# Patient Record
Sex: Male | Born: 1955 | Race: Black or African American | Hispanic: No | Marital: Married | Smoking: Never smoker
Health system: Southern US, Community
[De-identification: ages and names within clinical notes are randomized; demographics above are authoritative.]

## PROBLEM LIST (undated history)

## (undated) DIAGNOSIS — E7800 Pure hypercholesterolemia, unspecified: Secondary | ICD-10-CM

## (undated) DIAGNOSIS — I10 Essential (primary) hypertension: Secondary | ICD-10-CM

---

## 1997-12-06 ENCOUNTER — Ambulatory Visit (HOSPITAL_COMMUNITY): Admission: RE | Admit: 1997-12-06 | Discharge: 1997-12-06 | Payer: Self-pay | Admitting: *Deleted

## 1998-09-17 ENCOUNTER — Emergency Department (HOSPITAL_COMMUNITY): Admission: EM | Admit: 1998-09-17 | Discharge: 1998-09-17 | Payer: Self-pay | Admitting: Emergency Medicine

## 1998-12-07 ENCOUNTER — Emergency Department (HOSPITAL_COMMUNITY): Admission: EM | Admit: 1998-12-07 | Discharge: 1998-12-08 | Payer: Self-pay | Admitting: Emergency Medicine

## 1999-01-19 ENCOUNTER — Emergency Department (HOSPITAL_COMMUNITY): Admission: EM | Admit: 1999-01-19 | Discharge: 1999-01-19 | Payer: Self-pay | Admitting: Emergency Medicine

## 1999-03-08 ENCOUNTER — Emergency Department (HOSPITAL_COMMUNITY): Admission: EM | Admit: 1999-03-08 | Discharge: 1999-03-08 | Payer: Self-pay | Admitting: Emergency Medicine

## 1999-03-08 ENCOUNTER — Encounter: Payer: Self-pay | Admitting: Emergency Medicine

## 1999-03-11 ENCOUNTER — Encounter: Payer: Self-pay | Admitting: Emergency Medicine

## 1999-03-11 ENCOUNTER — Emergency Department (HOSPITAL_COMMUNITY): Admission: EM | Admit: 1999-03-11 | Discharge: 1999-03-11 | Payer: Self-pay | Admitting: Emergency Medicine

## 1999-05-12 ENCOUNTER — Ambulatory Visit (HOSPITAL_COMMUNITY): Admission: RE | Admit: 1999-05-12 | Discharge: 1999-05-12 | Payer: Self-pay | Admitting: Gastroenterology

## 1999-10-15 ENCOUNTER — Emergency Department (HOSPITAL_COMMUNITY): Admission: EM | Admit: 1999-10-15 | Discharge: 1999-10-15 | Payer: Self-pay | Admitting: Emergency Medicine

## 2002-08-19 ENCOUNTER — Encounter: Payer: Self-pay | Admitting: Emergency Medicine

## 2002-08-19 ENCOUNTER — Emergency Department (HOSPITAL_COMMUNITY): Admission: EM | Admit: 2002-08-19 | Discharge: 2002-08-19 | Payer: Self-pay | Admitting: Emergency Medicine

## 2002-10-01 ENCOUNTER — Ambulatory Visit (HOSPITAL_BASED_OUTPATIENT_CLINIC_OR_DEPARTMENT_OTHER): Admission: RE | Admit: 2002-10-01 | Discharge: 2002-10-01 | Payer: Self-pay | Admitting: Pulmonary Disease

## 2002-12-18 ENCOUNTER — Ambulatory Visit (HOSPITAL_BASED_OUTPATIENT_CLINIC_OR_DEPARTMENT_OTHER): Admission: RE | Admit: 2002-12-18 | Discharge: 2002-12-18 | Payer: Self-pay | Admitting: Pulmonary Disease

## 2002-12-29 ENCOUNTER — Emergency Department (HOSPITAL_COMMUNITY): Admission: EM | Admit: 2002-12-29 | Discharge: 2002-12-29 | Payer: Self-pay | Admitting: Emergency Medicine

## 2005-02-22 ENCOUNTER — Emergency Department (HOSPITAL_COMMUNITY): Admission: EM | Admit: 2005-02-22 | Discharge: 2005-02-22 | Payer: Self-pay | Admitting: Emergency Medicine

## 2007-04-23 ENCOUNTER — Emergency Department (HOSPITAL_COMMUNITY): Admission: EM | Admit: 2007-04-23 | Discharge: 2007-04-23 | Payer: Self-pay | Admitting: Emergency Medicine

## 2009-11-15 ENCOUNTER — Encounter: Admission: RE | Admit: 2009-11-15 | Discharge: 2009-11-15 | Payer: Self-pay | Admitting: Internal Medicine

## 2010-11-16 ENCOUNTER — Emergency Department (HOSPITAL_COMMUNITY)
Admission: EM | Admit: 2010-11-16 | Discharge: 2010-11-17 | Disposition: A | Discharge status: Home / Self Care | Payer: Managed Care, Other (non HMO) | Attending: Emergency Medicine | Admitting: Emergency Medicine

## 2010-11-16 DIAGNOSIS — T783XXA Angioneurotic edema, initial encounter: Secondary | ICD-10-CM | POA: Insufficient documentation

## 2010-11-16 DIAGNOSIS — E785 Hyperlipidemia, unspecified: Secondary | ICD-10-CM | POA: Insufficient documentation

## 2010-11-16 DIAGNOSIS — X58XXXA Exposure to other specified factors, initial encounter: Secondary | ICD-10-CM | POA: Insufficient documentation

## 2010-11-16 DIAGNOSIS — I10 Essential (primary) hypertension: Secondary | ICD-10-CM | POA: Insufficient documentation

## 2018-01-01 ENCOUNTER — Emergency Department (HOSPITAL_COMMUNITY): Payer: Managed Care, Other (non HMO)

## 2018-01-01 ENCOUNTER — Emergency Department (HOSPITAL_COMMUNITY)
Admission: EM | Admit: 2018-01-01 | Discharge: 2018-01-01 | Disposition: A | Discharge status: Home / Self Care | Payer: Managed Care, Other (non HMO) | Attending: Emergency Medicine | Admitting: Emergency Medicine

## 2018-01-01 ENCOUNTER — Encounter (HOSPITAL_COMMUNITY): Payer: Self-pay

## 2018-01-01 ENCOUNTER — Other Ambulatory Visit: Payer: Self-pay

## 2018-01-01 DIAGNOSIS — R0789 Other chest pain: Secondary | ICD-10-CM | POA: Insufficient documentation

## 2018-01-01 DIAGNOSIS — Z79899 Other long term (current) drug therapy: Secondary | ICD-10-CM | POA: Diagnosis not present

## 2018-01-01 DIAGNOSIS — I10 Essential (primary) hypertension: Secondary | ICD-10-CM | POA: Insufficient documentation

## 2018-01-01 HISTORY — DX: Essential (primary) hypertension: I10

## 2018-01-01 MED ORDER — CYCLOBENZAPRINE HCL 10 MG PO TABS
10.0000 mg | ORAL_TABLET | Freq: Once | ORAL | Status: AC
  Administered 2018-01-01: 10 mg via ORAL
  Filled 2018-01-01: qty 1

## 2018-01-01 MED ORDER — CYCLOBENZAPRINE HCL 10 MG PO TABS: 10.0000 mg | ORAL_TABLET | Freq: Two times a day (BID) | ORAL | 0 refills | Status: AC | PRN

## 2018-01-01 MED ORDER — IBUPROFEN 800 MG PO TABS
800.0000 mg | ORAL_TABLET | Freq: Once | ORAL | Status: AC
  Administered 2018-01-01: 800 mg via ORAL
  Filled 2018-01-01: qty 1

## 2018-01-01 MED ORDER — IBUPROFEN 800 MG PO TABS: 800.0000 mg | ORAL_TABLET | Freq: Three times a day (TID) | ORAL | 0 refills | Status: AC | PRN

## 2018-01-01 NOTE — ED Provider Notes (Signed)
[facility]-EMERGENCY DEPT Provider Note   CSN: [id number] Arrival date & time: 01/01/18  1009     History   Chief Complaint Chief Complaint  Patient presents with  . Muscle Pain    HPI Ryan Heath is a 62 y.o. male.  The history is provided by the patient. No language interpreter was used.  Muscle Pain    Ryan Heath is a 62 y.o. male who presents to the Emergency Department complaining of muscle spasm. Patient presents to the emergency department for evaluation of right upper chest wall pain that he describes as a muscle spasm. The pain began when he was shaving his head. He states that the spasm sends a pain that radiates down his right arm to the elbow. Pain is worse with ranging the shoulder as well as turning his head from side to side. Overall the pain is improving since it began. He denies any headache, shortness of breath, cough, diaphoresis, nausea, vomiting, abdominal pain, leg swelling or pain. He has a history of hypertension and hyperlipidemia. He does not smoke. 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 No history of cardiac disease or blood clots. No family history of cardiac disease or blood clots. Past Medical History:  Diagnosis Date  . Hypertension     There are no active problems to display for this patient.   History reviewed. No pertinent surgical history.      Home Medications    Prior to Admission medications   Medication Sig Start Date End Date Taking? Authorizing Provider  amLODipine (NORVASC) 10 MG tablet Take 10 mg by mouth daily. 12/26/17  Yes [provider]  gabapentin (NEURONTIN) 100 MG capsule Take 100-400 mg by mouth at bedtime as needed (pain).  12/12/17  Yes [provider]  lisinopril (PRINIVIL,ZESTRIL) 20 MG tablet Take 20 mg by mouth daily. 12/26/17  Yes [provider]  pravastatin (PRAVACHOL) 20 MG tablet Take 20 mg by mouth daily. 12/26/17  Yes [provider]  cyclobenzaprine (FLEXERIL) 10 MG tablet Take 1 tablet (10 mg total) by mouth 2 (two) times daily as needed for muscle spasms. 01/01/18   Tilden Fossa, MD  ibuprofen (ADVIL,MOTRIN) 800 MG tablet Take 1 tablet (800 mg total) by mouth every 8 (eight) hours as needed. 01/01/18   Tilden Fossa, MD    Family History History reviewed. No pertinent family history.  Social History Social History   Tobacco Use  . Smoking status: Never Smoker  . Smokeless tobacco: Never Used  Substance Use Topics  . Alcohol use: Yes    Comment: OCC  . Drug use: Never     Allergies   Patient has no known allergies.   Review of Systems Review of Systems  All other systems reviewed and are negative.    Physical Exam Updated Vital Signs BP (!) 161/83 (BP Location: Left Arm)   Pulse 79   Temp 97.8 F (36.6 C) (Oral)   Resp 18   Ht 6\' 3"  (1.905 m)   Wt 120.2 kg   SpO2 98%   BMI 33.12 kg/m   Physical Exam  Constitutional: He is oriented to person, place, and time. He appears well-developed and well-nourished.  HENT:  Head: Normocephalic and atraumatic.  Cardiovascular: Normal rate and regular rhythm.  No murmur heard. Pulmonary/Chest: Effort normal and breath sounds normal. No respiratory distress.  Mild tenderness to palpation over the right upper chest wall.  Abdominal: Soft. There is no tenderness. There  is no rebound and no guarding.  Musculoskeletal: He exhibits no edema or tenderness.  2+ radial pulses bilaterally. Full range of motion intact throughout the right upper extremity  Neurological: He is alert and oriented to person, place, and time.  Five out of five strength in all four extremities with sensation to light touch intact in all four extremities  Skin: Skin is warm and dry.  Psychiatric: He has a normal mood and affect. His behavior is normal.  Nursing note and vitals reviewed.    ED Treatments / Results  Labs (all labs ordered are listed, but only  abnormal results are displayed) Labs Reviewed - No data to display  EKG EKG Interpretation  Date/Time:  Sunday January 01 2018 10:34:55 EDT Ventricular Rate:  74 PR Interval:    QRS Duration: 87 QT Interval:  354 QTC Calculation: 393 R Axis:   29 Text Interpretation:  Sinus rhythm Minimal ST elevation, anterior leads c/w early repolarization. Confirmed by Lucille Crichlow ([id number]) on 01/01/2018 11:09:28 AM   Radiology Dg Chest 2 View  Result Date: 01/01/2018 CLINICAL DATA:  Right chest pain EXAM: CHEST - 2 VIEW COMPARISON:  04/23/2007 FINDINGS: Lungs are clear.  No pleural effusion or pneumothorax. The heart is normal in size. Visualized osseous structures are within normal limits. IMPRESSION: Normal chest radiographs. Electronically Signed   By: Sriyesh  Krishnan M.D.   On: 01/01/2018 11:57    Procedures Procedures (including critical care time)  Medications Ordered in ED Medications  ibuprofen (ADVIL,MOTRIN) tablet 800 mg (has no administration in time range)  cyclobenzaprine (FLEXERIL) tablet 10 mg (has no administration in time range)     Initial Impression / Assessment and Plan / ED Course  I have reviewed the triage vital signs and the nursing notes.  Pertinent labs & imaging results that were available during my care of the patient were reviewed by me and considered in my medical decision making (see chart for details).     Patient here for evaluation of pain to the right upper chest wall that occurred when he was shaving his head. Pain is reproducible on palpation as well as range of motion of neck and range of motion of the right upper extremity. He is well appearing on examination and in no acute distress, neurovascular intact. Presentation is not consistent with ACS, PE, dissection. Discussed with patient home care for musculoskeletal pain. Discussed outpatient follow-up as well as return precautions.  Final Clinical Impressions(s) / ED Diagnoses   Final diagnoses:    Chest wall pain    ED Discharge Orders         Ordered    ibuprofen (ADVIL,MOTRIN) 800 MG tablet  Every 8 hours PRN     01/01/18 1208    cyclobenzaprine (FLEXERIL) 10 MG tablet  2 times daily PRN     08 /11/19 1208           Tilden Fossaees, Lynnae Ludemann, MD 01/01/18 1210

## 2018-01-01 NOTE — ED Triage Notes (Signed)
PT STS WHILE SHAVING, HE DEVELOPED A "CRAMP" IN THE RIGHT CHEST, THEN THE PAIN TRAVELED DOWN HIS ARM, CAUSING TINGLING IN THE HAND. PT DENIES CHEST OR SOB.  PT STS WHEN HE TAKES A DEEP BREATH, HE CN FEEL THE CRAMP IN HIS CHEST.

## 2018-01-21 ENCOUNTER — Other Ambulatory Visit: Payer: Self-pay

## 2018-01-21 ENCOUNTER — Encounter (HOSPITAL_COMMUNITY): Payer: Self-pay | Admitting: *Deleted

## 2018-01-21 ENCOUNTER — Emergency Department (HOSPITAL_COMMUNITY): Payer: Managed Care, Other (non HMO)

## 2018-01-21 ENCOUNTER — Emergency Department (HOSPITAL_COMMUNITY)
Admission: EM | Admit: 2018-01-21 | Discharge: 2018-01-21 | Disposition: A | Discharge status: Home / Self Care | Payer: Managed Care, Other (non HMO) | Attending: Emergency Medicine | Admitting: Emergency Medicine

## 2018-01-21 DIAGNOSIS — I10 Essential (primary) hypertension: Secondary | ICD-10-CM | POA: Insufficient documentation

## 2018-01-21 DIAGNOSIS — Z79899 Other long term (current) drug therapy: Secondary | ICD-10-CM | POA: Insufficient documentation

## 2018-01-21 DIAGNOSIS — M25562 Pain in left knee: Secondary | ICD-10-CM | POA: Insufficient documentation

## 2018-01-21 HISTORY — DX: Pure hypercholesterolemia, unspecified: E78.00

## 2018-01-21 MED ORDER — IBUPROFEN 600 MG PO TABS: 600.0000 mg | ORAL_TABLET | Freq: Three times a day (TID) | ORAL | 0 refills | Status: AC | PRN

## 2018-01-21 NOTE — ED Notes (Signed)
ED Provider at bedside. 

## 2018-01-21 NOTE — ED Notes (Signed)
Patient transported to X-ray 

## 2018-01-21 NOTE — ED Provider Notes (Signed)
[facility]-EMERGENCY DEPT Provider Note   CSN: [id number] Arrival date & time: 01/21/18  0906     History   Chief Complaint Chief Complaint  Patient presents with  . Knee Pain    Left    HPI Rob Buntingerry E Honeyman is a 62 y.o. male.  62 year old male with prior medical history documented below presents with complaint of left knee pain.  Patient reports left medial knee pain.  Symptoms started 2 weeks ago.  Pain is worse with ambulation or prolonged standing.  He denies any specific inciting injury.  He denies associated fever, nausea, vomiting, joint swelling, or other complaint.  He is taking ibuprofen at home with moderate relief of symptoms.  He has not yet seen an orthopedic specialist.  The history is provided by the patient and medical records.  Knee Pain   This is a chronic problem. The current episode started more than 1 week ago. The problem occurs daily. The problem has not changed since onset.The pain is present in the left knee. The quality of the pain is described as aching. The pain is mild. The symptoms are aggravated by standing and activity. He has tried OTC pain medications for the symptoms.    Past Medical History:  Diagnosis Date  . Hypercholesteremia   . Hypertension     There are no active problems to display for this patient.   History reviewed. No pertinent surgical history.      Home Medications    Prior to Admission medications   Medication Sig Start Date End Date Taking? Authorizing Provider  amLODipine (NORVASC) 10 MG tablet Take 10 mg by mouth daily. 12/26/17   [provider]  cyclobenzaprine (FLEXERIL) 10 MG tablet Take 1 tablet (10 mg total) by mouth 2 (two) times daily as needed for muscle spasms. 01/01/18   Tilden Fossaees, Elizabeth, MD  gabapentin (NEURONTIN) 100 MG capsule Take 100-400 mg by mouth at bedtime as needed (pain).  12/12/17   [provider]  ibuprofen (ADVIL,MOTRIN) 600 MG tablet Take 1 tablet (600 mg  total) by mouth every 8 (eight) hours as needed. 01/21/18   Wynetta FinesMessick, Christipher Rieger C, MD  ibuprofen (ADVIL,MOTRIN) 800 MG tablet Take 1 tablet (800 mg total) by mouth every 8 (eight) hours as needed. 01/01/18   Tilden Fossaees, Elizabeth, MD  lisinopril (PRINIVIL,ZESTRIL) 20 MG tablet Take 20 mg by mouth daily. 12/26/17   [provider]  pravastatin (PRAVACHOL) 20 MG tablet Take 20 mg by mouth daily. 12/26/17   [provider]    Family History No family history on file.  Social History Social History   Tobacco Use  . Smoking status: Never Smoker  . Smokeless tobacco: Never Used  Substance Use Topics  . Alcohol use: Yes    Comment: OCC  . Drug use: Never     Allergies   Patient has no known allergies.   Review of Systems Review of Systems  Musculoskeletal: Positive for arthralgias.  All other systems reviewed and are negative.    Physical Exam Updated Vital Signs BP (!) 150/80   Pulse 89   Temp 98 F (36.7 C) (Oral)   Resp 16   Ht 6\' 3"  (1.905 m)   Wt 120.2 kg   SpO2 100%   BMI 33.12 kg/m   Physical Exam  Constitutional: He is oriented to person, place, and time. He appears well-developed and well-nourished. No distress.  HENT:  Head: Normocephalic and atraumatic.  Mouth/Throat: Oropharynx is clear and moist.  Eyes:  Pupils are equal, round, and reactive to light. Conjunctivae and EOM are normal.  Neck: Normal range of motion. Neck supple.  Cardiovascular: Normal rate, regular rhythm and normal heart sounds.  Pulmonary/Chest: Effort normal and breath sounds normal. No respiratory distress.  Abdominal: Soft. He exhibits no distension. There is no tenderness.  Musculoskeletal: Normal range of motion. He exhibits no edema or deformity.  Mild tenderness with palpation to the medial aspect of the left knee.  Left knee is with full active range of motion.  Joint is stable.  There is no effusion noted.  There is no overlying cellulitis or erythema.  Distal left lower  extremity is neurovascular intact.  Patient is ambulatory with a stable gait.  Neurological: He is alert and oriented to person, place, and time.  Skin: Skin is warm and dry.  Psychiatric: He has a normal mood and affect.  Nursing note and vitals reviewed.    ED Treatments / Results  Labs (all labs ordered are listed, but only abnormal results are displayed) Labs Reviewed - No data to display  EKG None  Radiology Dg Knee Complete 4 Views Left  Result Date: 01/21/2018 CLINICAL DATA:  Left knee pain for the past 2-3 weeks. No known injury. EXAM: LEFT KNEE - COMPLETE 4+ VIEW COMPARISON:  None. FINDINGS: No acute fracture or dislocation. No significant joint effusion. Mild medial compartment joint space narrowing. Bone mineralization is normal. Soft tissues are unremarkable. Vascular calcifications. IMPRESSION: 1.  No acute osseous abnormality. 2. Mild medial compartment osteoarthritis. Electronically Signed   By: Obie Dredge M.D.   On: 01/21/2018 09:47    Procedures Procedures (including critical care time)  Medications Ordered in ED Medications - No data to display   Initial Impression / Assessment and Plan / ED Course  I have reviewed the triage vital signs and the nursing notes.  Pertinent labs & imaging results that were available during my care of the patient were reviewed by me and considered in my medical decision making (see chart for details).     MDM  Screen complete  Patient is presenting for evaluation of his left knee discomfort.  Patient's history is most consistent with likely arthritic pain.  Screening x-ray supports this diagnosis.  Patient given extensive education regarding treatment of arthritic pain.   Close follow up is stressed with orthopedics. Strict return precautions given and understood.     Final Clinical Impressions(s) / ED Diagnoses   Final diagnoses:  Left knee pain, unspecified chronicity    ED Discharge Orders         Ordered      ibuprofen (ADVIL,MOTRIN) 600 MG tablet  Every 8 hours PRN     01/21/18 1009           Wynetta Fines, MD 01/21/18 1013

## 2018-01-21 NOTE — Discharge Instructions (Signed)
Please return for any problem. Follow up with your regular physician and orthopedics as instructed.

## 2018-01-21 NOTE — ED Triage Notes (Signed)
Pt c/o left knee pain for last 2.5 weeks, pain and difficulty walking, pain is on lateral sides, no swelling noted,

## 2018-01-31 ENCOUNTER — Ambulatory Visit (INDEPENDENT_AMBULATORY_CARE_PROVIDER_SITE_OTHER): Payer: Managed Care, Other (non HMO) | Admitting: Orthopedic Surgery

## 2018-01-31 ENCOUNTER — Encounter (INDEPENDENT_AMBULATORY_CARE_PROVIDER_SITE_OTHER): Payer: Self-pay | Admitting: Orthopedic Surgery

## 2018-01-31 DIAGNOSIS — M1712 Unilateral primary osteoarthritis, left knee: Secondary | ICD-10-CM | POA: Diagnosis not present

## 2018-01-31 MED ORDER — METHYLPREDNISOLONE ACETATE 40 MG/ML IJ SUSP
40.0000 mg | INTRAMUSCULAR | Status: AC | PRN
  Administered 2018-01-31: 40 mg via INTRA_ARTICULAR

## 2018-01-31 MED ORDER — BUPIVACAINE HCL 0.25 % IJ SOLN
4.0000 mL | INTRAMUSCULAR | Status: AC | PRN
  Administered 2018-01-31: 4 mL via INTRA_ARTICULAR

## 2018-01-31 MED ORDER — LIDOCAINE HCL 1 % IJ SOLN
5.0000 mL | INTRAMUSCULAR | Status: AC | PRN
  Administered 2018-01-31: 5 mL

## 2018-01-31 NOTE — Progress Notes (Addendum)
Office Visit Note   Patient: Ryan Heath           Date of Birth: [birth year]           MRN: [id number] Visit Date: 01/31/2018 Requested by: Frederich Chick., MD [address] [place], [place] [address] PCP: Frederich Chick., MD  Subjective: Chief Complaint  Patient presents with  . Left Knee - Pain    HPI: Ryan Heath is a patient with left knee pain.  Been going on for 1 month.  Denies a history of injury.  Reports primarily medial sided pain with some popping.  Does not wake him from sleep.  He wears a knee sleeve with some relief.  Radiographs on the 10 system demonstrate mild medial compartment arthritis.  Did go to the emergency room due to pain on 831.  Takes ibuprofen as needed.  Has history of right knee arthroscopy 5 years ago and did well with that.  No history of gout.  He works driving a Chief Executive Officer.              ROS: All systems reviewed are negative as they relate to the chief complaint within the history of present illness.  Patient denies  fevers or chills.   Assessment & Plan: Visit Diagnoses:  1. Unilateral primary osteoarthritis, left knee     Plan: Impression is left knee pain 1 month duration with mild arthritis on radiographs and focal medial pain.  I think this could represent a combination of mild arthritis and possible degenerative meniscal tearing.  No effusion today.  Injection left knee performed.  Out of work today and tomorrow.  6-week return to decide for or against MRI scanning.  Follow-Up Instructions: Return in about 6 weeks (around 03/14/2018).   Orders:  No orders of the defined types were placed in this encounter.  No orders of the defined types were placed in this encounter.     Procedures: Large Joint Inj: L knee on 01/31/2018 9:53 AM Indications: diagnostic evaluation, joint swelling and pain Details: 18 G 1.5 in needle, superolateral approach  Arthrogram: No  Medications: 5 mL lidocaine 1 %; 40 mg methylPREDNISolone acetate 40  MG/ML; 4 mL bupivacaine 0.25 % Outcome: tolerated well, no immediate complications Procedure, treatment alternatives, risks and benefits explained, specific risks discussed. Consent was given by the patient. Immediately prior to procedure a time out was called to verify the correct patient, procedure, equipment, support staff and site/side marked as required. Patient was prepped and draped in the usual sterile fashion.       Clinical Data: No additional findings.  Objective: Vital Signs: There were no vitals taken for this visit.  Physical Exam:   Constitutional: Patient appears well-developed HEENT:  Head: Normocephalic Eyes:EOM are normal Neck: Normal range of motion Cardiovascular: Normal rate Pulmonary/chest: Effort normal Neurologic: Patient is alert Skin: Skin is warm Psychiatric: Patient has normal mood and affect    Ortho Exam: Ortho exam demonstrates normal gait alignment with no effusion that left knee.  Range of motion is full.  Collateral and cruciate ligaments are stable.  Medial joint line tenderness is present with no extensor mechanism tenderness.  Pedal pulses palpable.  No other masses lymphadenopathy or skin changes noted in that left knee region.  Specialty Comments:  No specialty comments available.  Imaging: No results found.   PMFS History: There are no active problems to display for this patient.  Past Medical History:  Diagnosis Date  . Hypercholesteremia   .  Hypertension     History reviewed. No pertinent family history.  History reviewed. No pertinent surgical history. Social History   Occupational History  . Not on file  Tobacco Use  . Smoking status: Never Smoker  . Smokeless tobacco: Never Used  Substance and Sexual Activity  . Alcohol use: Yes    Comment: OCC  . Drug use: Never  . Sexual activity: Not on file

## 2018-03-15 ENCOUNTER — Ambulatory Visit (INDEPENDENT_AMBULATORY_CARE_PROVIDER_SITE_OTHER): Payer: Managed Care, Other (non HMO) | Admitting: Orthopedic Surgery

## 2019-03-21 IMAGING — CR DG KNEE COMPLETE 4+V*L*
4 series · 4 of 4 positions shown · non-contrast
Comparison: None.

CLINICAL DATA: Left knee pain for the past 2-3 weeks. No known
injury.

EXAM:
LEFT KNEE - COMPLETE 4+ VIEW

[x knee ap left (1 of 4)]
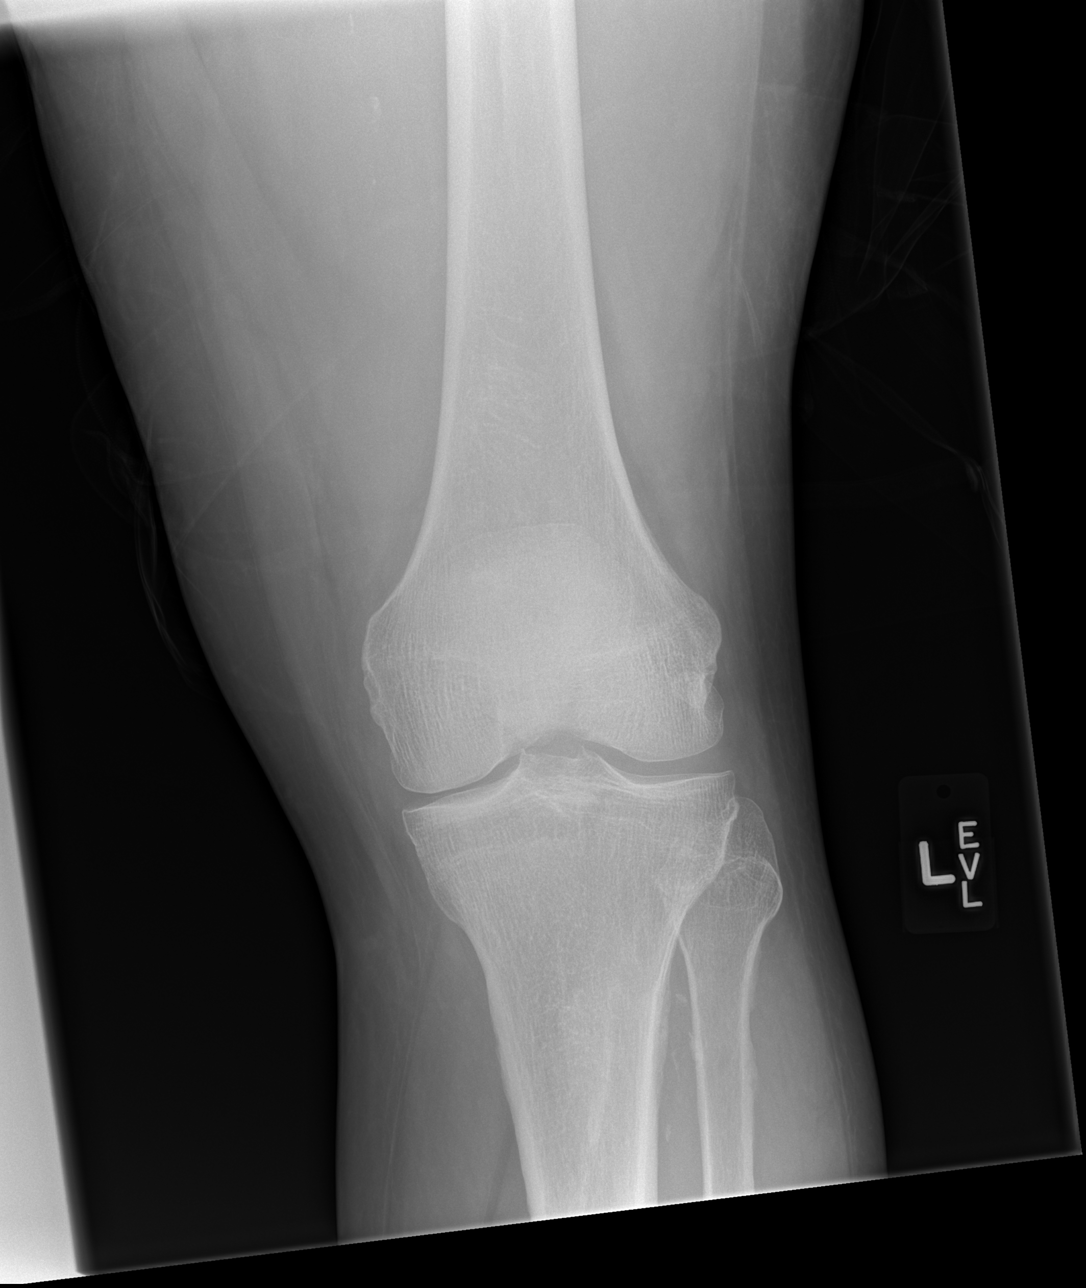

[x knee ap left (2 of 4)]
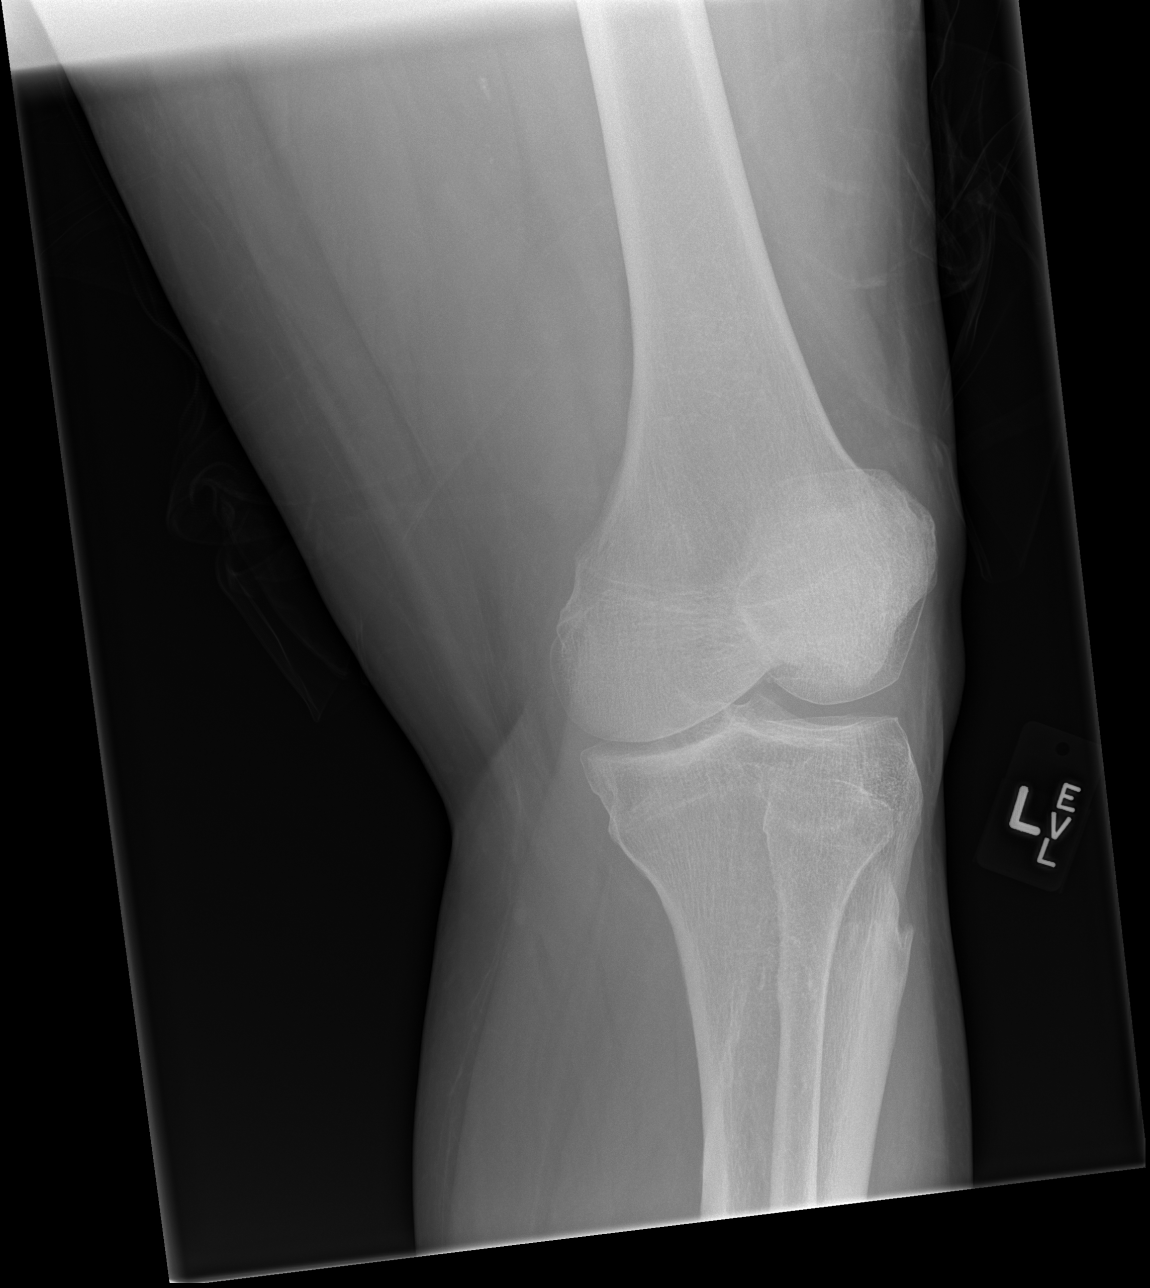

[x knee ap left (3 of 4)]
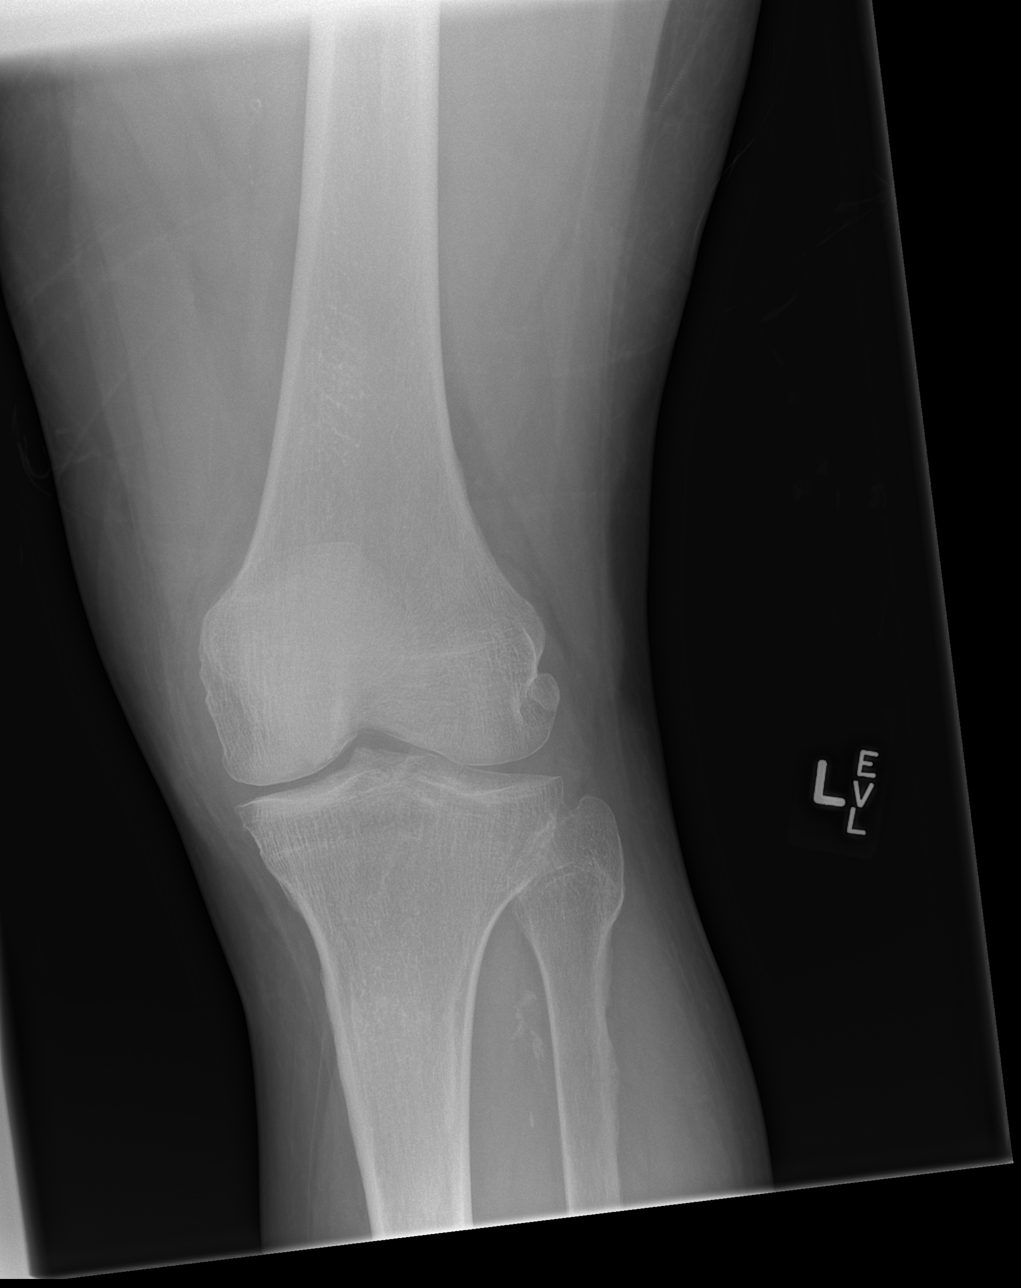

[x knee ap left (4 of 4)]
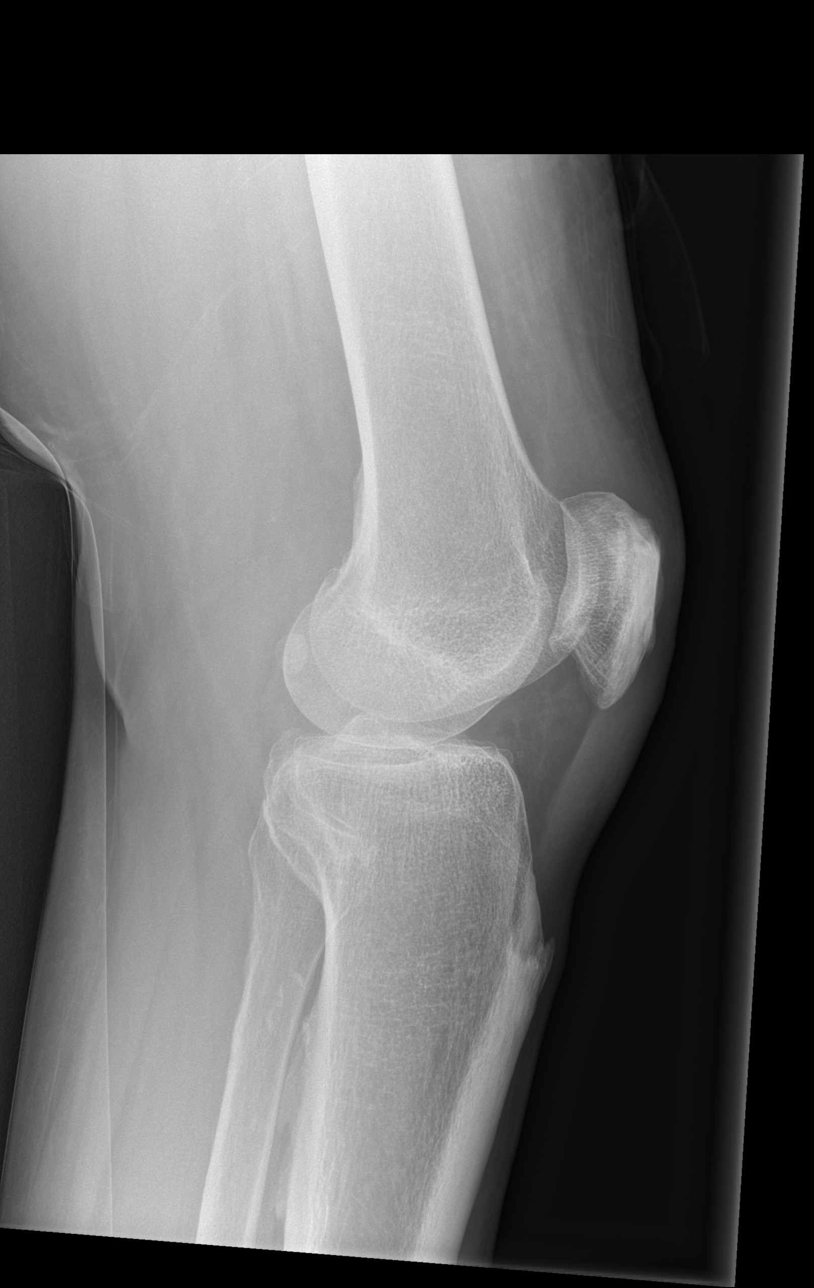

[4 of 4 positions shown; findings below may reference images not displayed]

FINDINGS: No acute fracture or dislocation. No significant joint effusion.
Mild medial compartment joint space narrowing. Bone mineralization
is normal. Soft tissues are unremarkable. Vascular calcifications.
IMPRESSION: 1.  No acute osseous abnormality.
2. Mild medial compartment osteoarthritis.

## 2021-08-19 ENCOUNTER — Other Ambulatory Visit: Payer: Self-pay | Admitting: Cardiovascular Disease

## 2021-08-19 ENCOUNTER — Other Ambulatory Visit (HOSPITAL_COMMUNITY): Payer: Self-pay | Admitting: Cardiovascular Disease

## 2021-08-19 DIAGNOSIS — E7800 Pure hypercholesterolemia, unspecified: Secondary | ICD-10-CM

## 2021-08-24 ENCOUNTER — Encounter (HOSPITAL_COMMUNITY): Payer: Self-pay | Admitting: Emergency Medicine

## 2021-09-07 ENCOUNTER — Encounter (HOSPITAL_BASED_OUTPATIENT_CLINIC_OR_DEPARTMENT_OTHER): Payer: Self-pay

## 2021-09-07 ENCOUNTER — Ambulatory Visit (HOSPITAL_BASED_OUTPATIENT_CLINIC_OR_DEPARTMENT_OTHER)
Admission: RE | Admit: 2021-09-07 | Discharge: 2021-09-07 | Disposition: A | Discharge status: Home / Self Care | Payer: Medicare (Managed Care) | Source: Ambulatory Visit | Attending: Cardiology | Admitting: Cardiology

## 2021-09-07 DIAGNOSIS — E7800 Pure hypercholesterolemia, unspecified: Secondary | ICD-10-CM

## 2022-07-04 ENCOUNTER — Emergency Department (HOSPITAL_COMMUNITY)
Admission: EM | Admit: 2022-07-04 | Discharge: 2022-07-04 | Disposition: A | Discharge status: Home / Self Care | Payer: Medicare (Managed Care) | Attending: Emergency Medicine | Admitting: Emergency Medicine

## 2022-07-04 DIAGNOSIS — R7989 Other specified abnormal findings of blood chemistry: Secondary | ICD-10-CM

## 2022-07-04 DIAGNOSIS — Z79899 Other long term (current) drug therapy: Secondary | ICD-10-CM | POA: Diagnosis not present

## 2022-07-04 DIAGNOSIS — R944 Abnormal results of kidney function studies: Secondary | ICD-10-CM | POA: Diagnosis not present

## 2022-07-04 DIAGNOSIS — R339 Retention of urine, unspecified: Secondary | ICD-10-CM | POA: Diagnosis present

## 2022-07-04 DIAGNOSIS — N3090 Cystitis, unspecified without hematuria: Secondary | ICD-10-CM | POA: Insufficient documentation

## 2022-07-04 DIAGNOSIS — I10 Essential (primary) hypertension: Secondary | ICD-10-CM | POA: Diagnosis not present

## 2022-07-04 DIAGNOSIS — R39198 Other difficulties with micturition: Secondary | ICD-10-CM

## 2022-07-04 LAB — URINALYSIS, W/ REFLEX TO CULTURE (INFECTION SUSPECTED)
Bilirubin Urine: NEGATIVE
Glucose, UA: NEGATIVE mg/dL
Hgb urine dipstick: NEGATIVE
Ketones, ur: NEGATIVE mg/dL
Nitrite: NEGATIVE
Protein, ur: 100 mg/dL — AB
Specific Gravity, Urine: 1.025 (ref 1.005–1.030)
WBC, UA: >50 WBC/hpf (ref 0–5)
pH: 5 (ref 5.0–8.0)

## 2022-07-04 LAB — I-STAT CHEM 8, ED
BUN: 16 mg/dL (ref 8–23)
Calcium, Ion: 1.2 mmol/L (ref 1.15–1.40)
Chloride: 103 mmol/L (ref 98–111)
Creatinine, Ser: 1.4 mg/dL — ABNORMAL HIGH (ref 0.61–1.24)
Glucose, Bld: 127 mg/dL — ABNORMAL HIGH (ref 70–99)
HCT: 42 % (ref 39.0–52.0)
Hemoglobin: 14.3 g/dL (ref 13.0–17.0)
Potassium: 3.6 mmol/L (ref 3.5–5.1)
Sodium: 142 mmol/L (ref 135–145)
TCO2: 27 mmol/L (ref 22–32)

## 2022-07-04 MED ORDER — CEPHALEXIN 500 MG PO CAPS: 500.0000 mg | ORAL_CAPSULE | Freq: Two times a day (BID) | ORAL | 0 refills | Status: AC

## 2022-07-04 MED ORDER — TAMSULOSIN HCL 0.4 MG PO CAPS: 0.4000 mg | ORAL_CAPSULE | Freq: Every day | ORAL | 0 refills | Status: AC

## 2022-07-04 NOTE — Discharge Instructions (Addendum)
You have been seen today for your complaint of difficulty urinating. Your lab work showed that you have a urinary tract infection. Your discharge medications include Keflex. This is an antibiotic. You should take it as prescribed. You should take it for the entire duration of the prescription. This may cause an upset stomach. This is normal. You may take this with food. You may also eat yogurt to prevent diarrhea. Flomax.  This is a medication used to help you urinate.  Take it once daily as prescribed until you can follow-up with urology. Follow up with: Dr. Milford Cage.  He is a urologist.  Dennis Bast should call tomorrow to schedule an appointment for an ED follow-up visit Please seek immediate medical care if you develop any of the following symptoms: You have chills or a fever. You have blood in your urine. You have a catheter and the following happens: Your catheter stops draining urine. Your catheter falls out. You are unable to void any urine At this time there does not appear to be the presence of an emergent medical condition, however there is always the potential for conditions to change. Please read and follow the below instructions.  Do not take your medicine if  develop an itchy rash, swelling in your mouth or lips, or difficulty breathing; call 911 and seek immediate emergency medical attention if this occurs.  You may review your lab tests and imaging results in their entirety on your MyChart account.  Please discuss all results of fully with your primary care provider and other specialist at your follow-up visit.  Note: Portions of this text may have been transcribed using voice recognition software. Every effort was made to ensure accuracy; however, inadvertent computerized transcription errors may still be present.

## 2022-07-04 NOTE — ED Provider Notes (Signed)
[place] EMERGENCY DEPARTMENT AT [facility] Provider Note   CSN: [id number] Arrival date & time: 07/04/22  [id number]     History  Chief Complaint  Patient presents with   Urinary Retention    Ryan Heath is a 67 y.o. male.  With a history of hypertension and hypercholesterolemia who presents to ED for evaluation of difficulty urinating.  He states that this began suddenly at approximately 4 days ago.  It has not changed during that time.  He has difficulty initiating his flow and difficulty maintaining it.  He states he is sometimes able to completely void and sometimes feels like there is a sensation of incomplete voiding.  He did fully void his bladder in the shower approximately 2 hours prior to arrival.  He states he has never had his prostate checked.  He denies fevers, chills, dysuria, urgency, lower abdominal pain, testicle pain, penile discharge.  He is not sexually active.  He has no other complaints.  HPI     Home Medications Prior to Admission medications   Medication Sig Start Date End Date Taking? Authorizing Provider  cephALEXin (KEFLEX) 500 MG capsule Take 1 capsule (500 mg total) by mouth 2 (two) times daily for 7 days. 07/04/22 07/11/22 Yes Sanja Elizardo, [place] Folk, PA-C  tamsulosin (FLOMAX) 0.4 MG CAPS capsule Take 1 capsule (0.4 mg total) by mouth daily. 07/04/22  Yes Arnulfo Batson, [place] Folk, PA-C  amLODipine (NORVASC) 10 MG tablet Take 10 mg by mouth daily. 12/26/17   [provider]  cyclobenzaprine (FLEXERIL) 10 MG tablet Take 1 tablet (10 mg total) by mouth 2 (two) times daily as needed for muscle spasms. 01/01/18   Quintella Reichert, MD  gabapentin (NEURONTIN) 100 MG capsule Take 100-400 mg by mouth at bedtime as needed (pain).  12/12/17   [provider]  ibuprofen (ADVIL,MOTRIN) 600 MG tablet Take 1 tablet (600 mg total) by mouth every 8 (eight) hours as needed. 01/21/18   Valarie Merino, MD  ibuprofen (ADVIL,MOTRIN) 800 MG tablet Take 1 tablet  (800 mg total) by mouth every 8 (eight) hours as needed. 01/01/18   Quintella Reichert, MD  lisinopril (PRINIVIL,ZESTRIL) 20 MG tablet Take 20 mg by mouth daily. 12/26/17   [provider]  pravastatin (PRAVACHOL) 20 MG tablet Take 20 mg by mouth daily. 12/26/17   [provider]      Allergies    Patient has no known allergies.    Review of Systems   Review of Systems  Genitourinary:  Positive for difficulty urinating.  All other systems reviewed and are negative.   Physical Exam Updated Vital Signs BP (!) 143/78 (BP Location: Left Arm)   Pulse 70   Temp 98.1 F (36.7 C) (Oral)   Resp 17   Ht 6' 3"$  (1.905 m)   Wt 117.9 kg   SpO2 99%   BMI 32.50 kg/m  Physical Exam Vitals and nursing note reviewed.  Constitutional:      General: He is not in acute distress.    Appearance: Normal appearance. He is well-developed. He is not ill-appearing.     Comments: Resting comfortably in bed  HENT:     Head: Normocephalic and atraumatic.  Eyes:     Conjunctiva/sclera: Conjunctivae normal.  Cardiovascular:     Rate and Rhythm: Normal rate and regular rhythm.     Heart sounds: No murmur heard. Pulmonary:     Effort: Pulmonary effort is normal. No respiratory distress.     Breath sounds: Normal  breath sounds. No stridor. No wheezing, rhonchi or rales.  Abdominal:     Palpations: Abdomen is soft.     Tenderness: There is no abdominal tenderness. There is no right CVA tenderness, left CVA tenderness, guarding or rebound.  Musculoskeletal:        General: No swelling.     Cervical back: Neck supple.     Right lower leg: No edema.     Left lower leg: No edema.  Skin:    General: Skin is warm and dry.     Capillary Refill: Capillary refill takes less than 2 seconds.  Neurological:     General: No focal deficit present.     Mental Status: He is alert and oriented to person, place, and time.  Psychiatric:        Mood and Affect: Mood normal.     ED Results / Procedures /  Treatments   Labs (all labs ordered are listed, but only abnormal results are displayed) Labs Reviewed  URINALYSIS, W/ REFLEX TO CULTURE (INFECTION SUSPECTED) - Abnormal; Notable for the following components:      Result Value   APPearance CLOUDY (*)    Protein, ur 100 (*)    Leukocytes,Ua LARGE (*)    Bacteria, UA RARE (*)    All other components within normal limits  I-STAT CHEM 8, ED - Abnormal; Notable for the following components:   Creatinine, Ser 1.40 (*)    Glucose, Bld 127 (*)    All other components within normal limits  URINE CULTURE    EKG None  Radiology No results found.  Procedures Procedures    Medications Ordered in ED Medications - No data to display  ED Course/ Medical Decision Making/ A&P                             Medical Decision Making This patient presents to the ED for concern of urinary retention, this involves an extensive number of treatment options, and is a complaint that carries with it a high risk of complications and morbidity.  The differential diagnosis includes UTI, BPH, nephrolithiasis, bladder abnormality, prostatitis  My initial workup includes bladder scan, urinalysis, kidney function test  Additional history obtained from: Nursing notes from this visit.  I ordered, reviewed and interpreted labs which include: I-STAT Chem-8, urinalysis.  Elevated creatinine to 1.4 with no baseline for comparison.  Urinalysis with signs of UTI  I ordered imaging studies including bladder scan I independently visualized and interpreted imaging which showed 250 to 300 mL  Afebrile, slightly hypertensive but otherwise hemodynamically stable.  67 year old male presenting to the ED for evaluation of difficulty in voiding.  Sudden onset 4 days ago.  No recent change.  No pain, fevers or chills.  He is able to void but feels like it is incomplete.  His bladder scan did show 250 to 300 mL of urine, but his last void was approximately 3 hours prior.  His  urinalysis did show signs of UTI.  This will be treated with Keflex.  Patient will be given prescription for Flomax for his symptoms and encouraged to follow-up with urology.  He was given contact information and encouraged to call tomorrow for an appointment.  I do not believe he needs a catheter at this time as he is still able to void.  Likely has BPH.  He was encouraged to follow-up with his primary care provider regarding the elevated creatinine.  He was  given return precautions.  Stable at discharge.  At this time there does not appear to be any evidence of an acute emergency medical condition and the patient appears stable for discharge with appropriate outpatient follow up. Diagnosis was discussed with patient who verbalizes understanding of care plan and is agreeable to discharge. I have discussed return precautions with patient who verbalizes understanding. Patient encouraged to follow-up with their PCP within 1 week. All questions answered.  Patient's case discussed with Dr. Kathrynn Humble who agrees with plan to discharge with follow-up.   Note: Portions of this report may have been transcribed using voice recognition software. Every effort was made to ensure accuracy; however, inadvertent computerized transcription errors may still be present.        Final Clinical Impression(s) / ED Diagnoses Final diagnoses:  Difficulty voiding  Elevated serum creatinine  Cystitis    Rx / DC Orders ED Discharge Orders          Ordered    tamsulosin (FLOMAX) 0.4 MG CAPS capsule  Daily        07/04/22 1100    cephALEXin (KEFLEX) 500 MG capsule  2 times daily        07/04/22 1100              Roylene Reason, Hershal Coria 07/04/22 1105    Varney Biles, MD 07/07/22 1524

## 2022-07-04 NOTE — ED Triage Notes (Signed)
Pt presents to ED from home C/O urinary retention X 4 days. Pt reports he has been able to pass small amounts of urine but "pressure is building up." Denies dysuria.

## 2022-07-07 LAB — URINE CULTURE: Culture: >=100000 — AB

## 2022-07-08 ENCOUNTER — Telehealth (HOSPITAL_BASED_OUTPATIENT_CLINIC_OR_DEPARTMENT_OTHER): Payer: Self-pay

## 2022-07-08 NOTE — Telephone Encounter (Signed)
Post ED Visit - Positive Culture Follow-up  Culture report reviewed by antimicrobial stewardship pharmacist: [place] Team []$  Elenor Quinones, Pharm.D. [x]$  Heide Guile, Pharm.D., BCPS AQ-ID []$  Parks Neptune, Pharm.D., BCPS []$  Alycia Rossetti, Pharm.D., BCPS []$  [place], Pharm.D., BCPS, AAHIVP []$  Legrand Como, Pharm.D., BCPS, AAHIVP []$  Salome Arnt, PharmD, BCPS []$  Johnnette Gourd, PharmD, BCPS []$  Hughes Better, PharmD, BCPS []$  Leeroy Cha, PharmD []$  Laqueta Linden, PharmD, BCPS []$  Albertina Parr, PharmD  [place] Team []$  Leodis Sias, PharmD []$  Lindell Spar, PharmD []$  Royetta Asal, PharmD []$  Graylin Shiver, Rph []$  Rema Fendt) Glennon Mac, PharmD []$  Arlyn Dunning, PharmD []$  Netta Cedars, PharmD []$  Dia Sitter, PharmD []$  Leone Haven, PharmD []$  Gretta Arab, PharmD []$  Theodis Shove, PharmD []$  Peggyann Juba, PharmD []$  Reuel Boom, PharmD   Positive urine culture Treated with Cephalexin, organism sensitive to the same and no further patient follow-up is required at this time.  Glennon Hamilton 07/08/2022, 8:22 AM
